# Patient Record
Sex: Male | Born: 1955 | Race: Black or African American | Hispanic: No | Marital: Married | Smoking: Never smoker
Health system: Southern US, Community
[De-identification: ages and names within clinical notes are randomized; demographics above are authoritative.]

## PROBLEM LIST (undated history)

## (undated) DIAGNOSIS — I10 Essential (primary) hypertension: Secondary | ICD-10-CM

## (undated) DIAGNOSIS — E119 Type 2 diabetes mellitus without complications: Secondary | ICD-10-CM

## (undated) DIAGNOSIS — E7800 Pure hypercholesterolemia, unspecified: Secondary | ICD-10-CM

## (undated) HISTORY — PX: HERNIA REPAIR: SHX51

---

## 2002-11-04 ENCOUNTER — Encounter: Payer: Self-pay | Admitting: Orthopedic Surgery

## 2002-11-04 ENCOUNTER — Ambulatory Visit (HOSPITAL_COMMUNITY): Admission: RE | Admit: 2002-11-04 | Discharge: 2002-11-04 | Payer: Self-pay | Admitting: Orthopedic Surgery

## 2003-07-13 ENCOUNTER — Ambulatory Visit (HOSPITAL_COMMUNITY): Admission: RE | Admit: 2003-07-13 | Discharge: 2003-07-13 | Payer: Self-pay | Admitting: Family Medicine

## 2003-07-13 ENCOUNTER — Encounter: Payer: Self-pay | Admitting: Family Medicine

## 2003-07-17 ENCOUNTER — Encounter: Payer: Self-pay | Admitting: Family Medicine

## 2003-07-17 ENCOUNTER — Ambulatory Visit (HOSPITAL_COMMUNITY): Admission: RE | Admit: 2003-07-17 | Discharge: 2003-07-17 | Payer: Self-pay | Admitting: Family Medicine

## 2005-07-01 ENCOUNTER — Encounter: Admission: RE | Admit: 2005-07-01 | Discharge: 2005-07-01 | Payer: Self-pay | Admitting: Emergency Medicine

## 2005-07-07 ENCOUNTER — Encounter: Admission: RE | Admit: 2005-07-07 | Discharge: 2005-07-07 | Payer: Self-pay | Admitting: Emergency Medicine

## 2005-07-25 ENCOUNTER — Encounter: Admission: RE | Admit: 2005-07-25 | Discharge: 2005-07-25 | Payer: Self-pay | Admitting: Emergency Medicine

## 2005-08-15 ENCOUNTER — Encounter: Admission: RE | Admit: 2005-08-15 | Discharge: 2005-08-15 | Payer: Self-pay | Admitting: Emergency Medicine

## 2016-03-23 ENCOUNTER — Emergency Department (HOSPITAL_COMMUNITY): Payer: Managed Care, Other (non HMO)

## 2016-03-23 ENCOUNTER — Observation Stay (HOSPITAL_COMMUNITY)
Admission: EM | Admit: 2016-03-23 | Discharge: 2016-03-24 | Disposition: A | Discharge status: Home / Self Care | Payer: Managed Care, Other (non HMO) | Attending: Internal Medicine | Admitting: Internal Medicine

## 2016-03-23 ENCOUNTER — Encounter (HOSPITAL_COMMUNITY): Payer: Self-pay | Admitting: *Deleted

## 2016-03-23 DIAGNOSIS — R079 Chest pain, unspecified: Secondary | ICD-10-CM | POA: Diagnosis present

## 2016-03-23 DIAGNOSIS — E119 Type 2 diabetes mellitus without complications: Secondary | ICD-10-CM | POA: Insufficient documentation

## 2016-03-23 DIAGNOSIS — I10 Essential (primary) hypertension: Secondary | ICD-10-CM | POA: Diagnosis not present

## 2016-03-23 DIAGNOSIS — E669 Obesity, unspecified: Secondary | ICD-10-CM | POA: Diagnosis not present

## 2016-03-23 DIAGNOSIS — R0789 Other chest pain: Secondary | ICD-10-CM | POA: Diagnosis not present

## 2016-03-23 DIAGNOSIS — I429 Cardiomyopathy, unspecified: Secondary | ICD-10-CM | POA: Diagnosis not present

## 2016-03-23 DIAGNOSIS — Z7984 Long term (current) use of oral hypoglycemic drugs: Secondary | ICD-10-CM | POA: Diagnosis not present

## 2016-03-23 DIAGNOSIS — Z7982 Long term (current) use of aspirin: Secondary | ICD-10-CM | POA: Diagnosis not present

## 2016-03-23 HISTORY — DX: Pure hypercholesterolemia, unspecified: E78.00

## 2016-03-23 HISTORY — DX: Type 2 diabetes mellitus without complications: E11.9

## 2016-03-23 HISTORY — DX: Essential (primary) hypertension: I10

## 2016-03-23 LAB — BASIC METABOLIC PANEL
ANION GAP: 9 (ref 5–15)
BUN: 14 mg/dL (ref 6–20)
CO2: 23 mmol/L (ref 22–32)
Calcium: 9.3 mg/dL (ref 8.9–10.3)
Chloride: 106 mmol/L (ref 101–111)
Creatinine, Ser: 1.03 mg/dL (ref 0.61–1.24)
GFR calc Af Amer: >60 mL/min (ref >60)
GLUCOSE: 153 mg/dL — AB (ref 65–99)
POTASSIUM: 4.1 mmol/L (ref 3.5–5.1)
Sodium: 138 mmol/L (ref 135–145)

## 2016-03-23 LAB — CBC
HCT: 37.1 % — ABNORMAL LOW (ref 39.0–52.0)
Hemoglobin: 12.2 g/dL — ABNORMAL LOW (ref 13.0–17.0)
MCH: 30.1 pg (ref 26.0–34.0)
MCHC: 32.9 g/dL (ref 30.0–36.0)
MCV: 91.6 fL (ref 78.0–100.0)
Platelets: 180 10*3/uL (ref 150–400)
RBC: 4.05 MIL/uL — ABNORMAL LOW (ref 4.22–5.81)
RDW: 12.6 % (ref 11.5–15.5)
WBC: 6.4 10*3/uL (ref 4.0–10.5)

## 2016-03-23 LAB — GLUCOSE, CAPILLARY: Glucose-Capillary: 110 mg/dL — ABNORMAL HIGH (ref 65–99)

## 2016-03-23 LAB — I-STAT TROPONIN, ED: Troponin i, poc: 0 ng/mL (ref 0.00–0.08)

## 2016-03-23 LAB — TROPONIN I

## 2016-03-23 LAB — CREATININE, SERUM: CREATININE: 1.01 mg/dL (ref 0.61–1.24)

## 2016-03-23 IMAGING — DX DG CHEST 2V
2 series · 2 of 2 positions shown · non-contrast
Comparison: None.

CLINICAL DATA: 59-year-old male with chest pain for 1 day. Initial
encounter.

EXAM:
CHEST  2 VIEW

[chest lat]
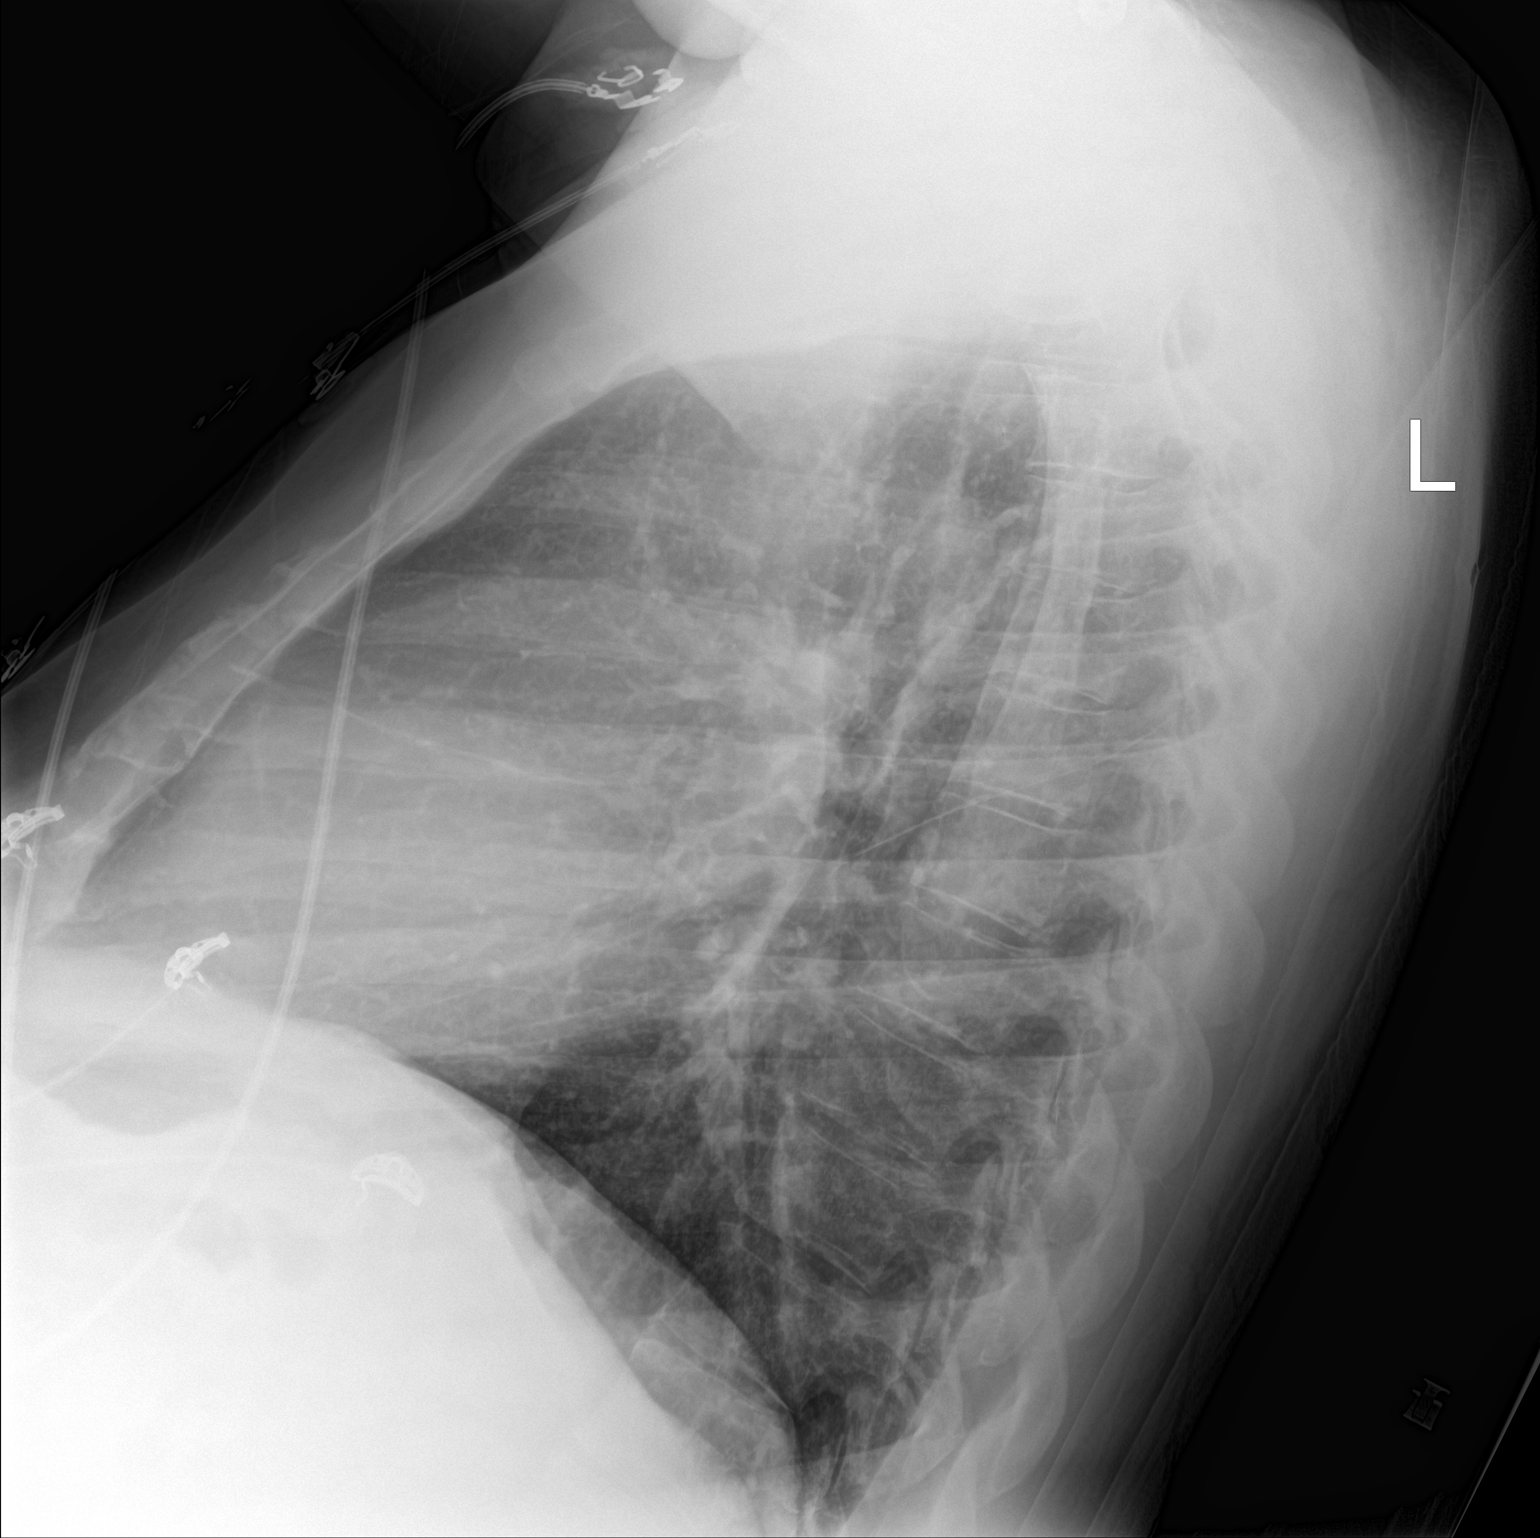

[chest ap]
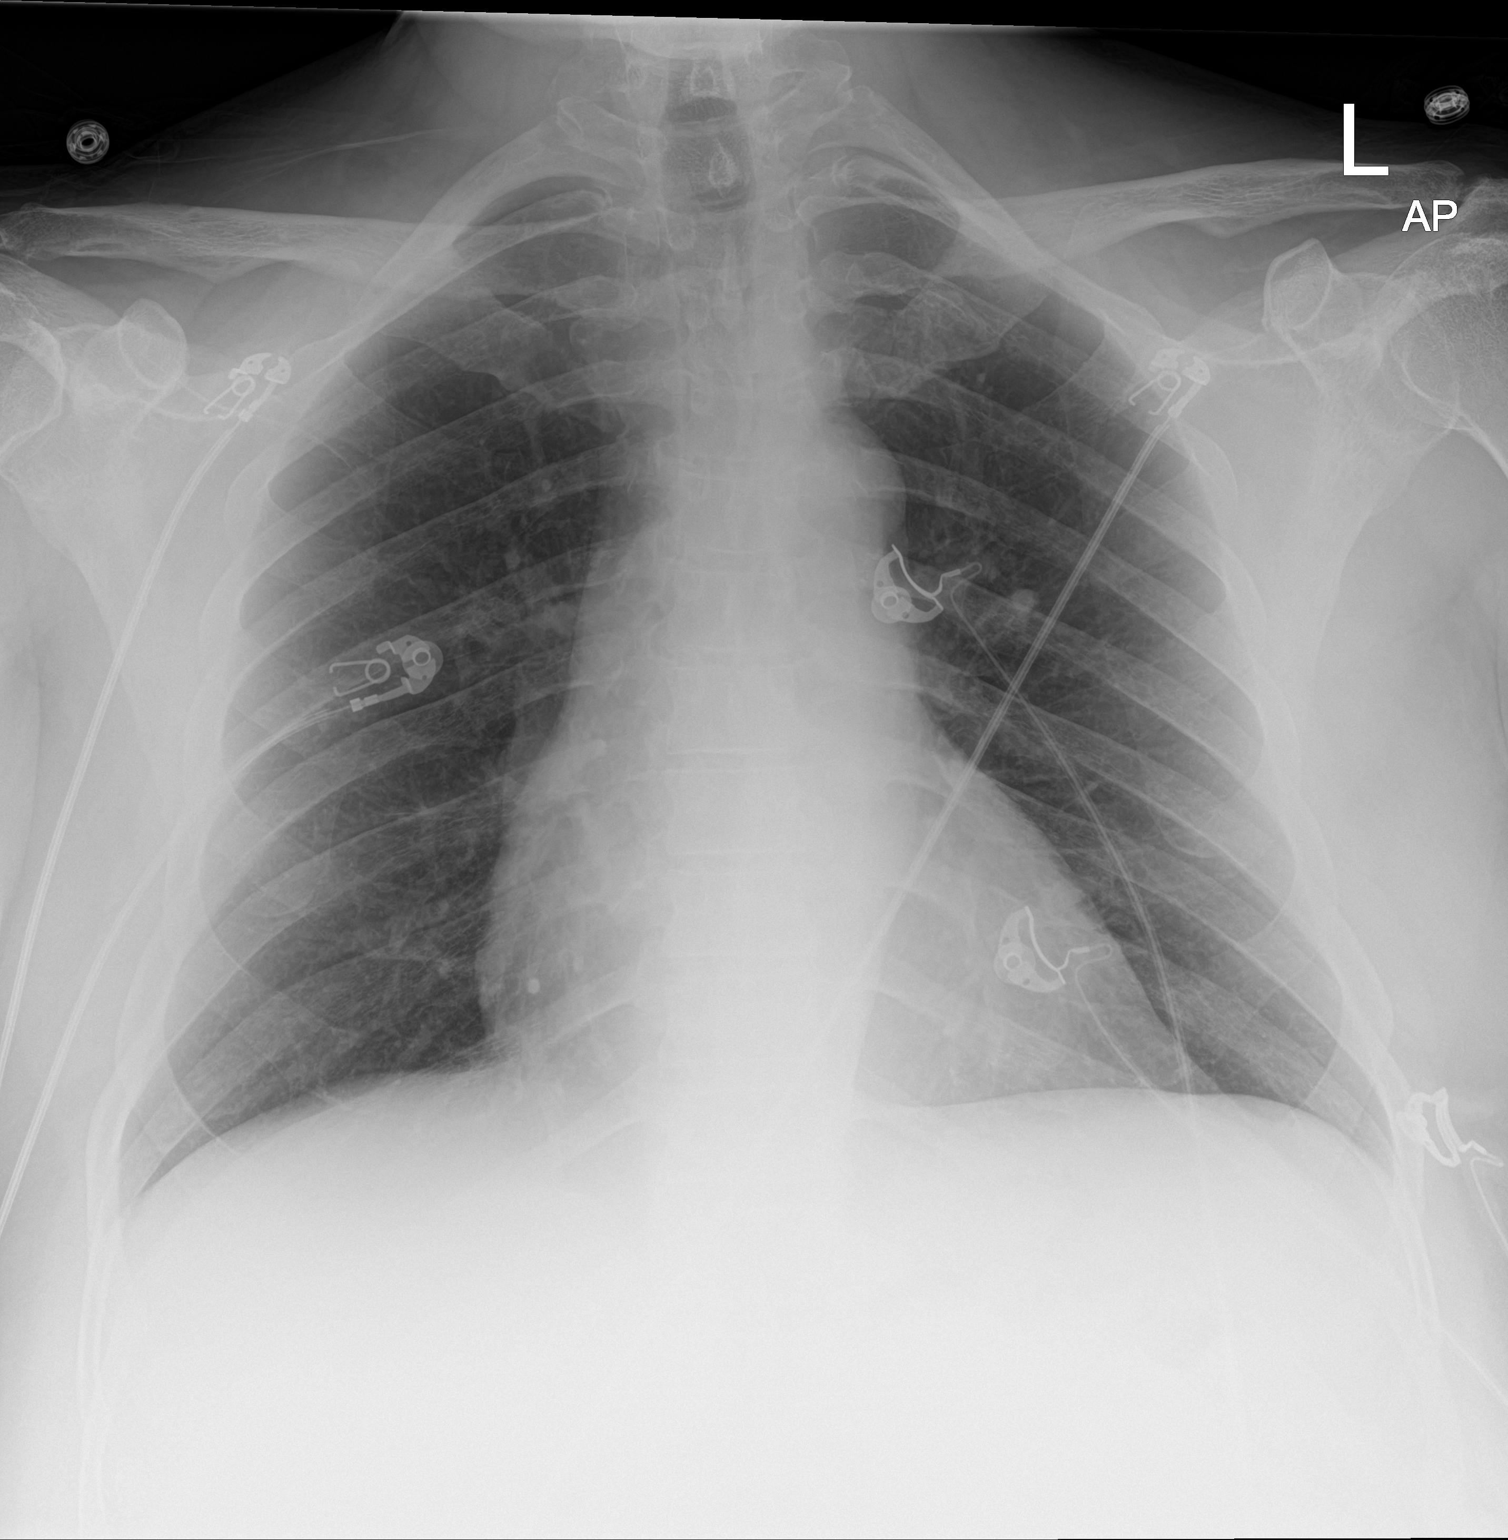

[2 of 2 positions shown; findings below may reference images not displayed]

FINDINGS: Normal lung volumes. Cardiac size at the upper limits of normal.
Other mediastinal contours are within normal limits. Visualized
tracheal air column is within normal limits. The lungs are clear. No
pneumothorax or pleural effusion. No osseous abnormality identified.
IMPRESSION: Negative, no acute cardiopulmonary abnormality.

## 2016-03-23 MED ORDER — ASPIRIN EC 81 MG PO TBEC
81.0000 mg | DELAYED_RELEASE_TABLET | Freq: Every day | ORAL | Status: DC
  Administered 2016-03-23: 81 mg via ORAL
  Filled 2016-03-23 (×2): qty 1

## 2016-03-23 MED ORDER — ONDANSETRON HCL 4 MG/2ML IJ SOLN: 4.0000 mg | Freq: Four times a day (QID) | INTRAMUSCULAR | Status: DC | PRN

## 2016-03-23 MED ORDER — GI COCKTAIL ~~LOC~~: 30.0000 mL | Freq: Four times a day (QID) | ORAL | Status: DC | PRN

## 2016-03-23 MED ORDER — ALPRAZOLAM 0.25 MG PO TABS: 0.2500 mg | ORAL_TABLET | Freq: Two times a day (BID) | ORAL | Status: DC | PRN

## 2016-03-23 MED ORDER — ATORVASTATIN CALCIUM 20 MG PO TABS
20.0000 mg | ORAL_TABLET | Freq: Every day | ORAL | Status: DC
  Administered 2016-03-23: 20 mg via ORAL
  Filled 2016-03-23: qty 1
  Filled 2016-03-23: qty 2

## 2016-03-23 MED ORDER — LISINOPRIL 10 MG PO TABS
10.0000 mg | ORAL_TABLET | Freq: Every day | ORAL | Status: DC
  Administered 2016-03-23: 10 mg via ORAL
  Filled 2016-03-23 (×2): qty 1

## 2016-03-23 MED ORDER — ENOXAPARIN SODIUM 40 MG/0.4ML ~~LOC~~ SOLN
40.0000 mg | SUBCUTANEOUS | Status: DC
  Administered 2016-03-23: 40 mg via SUBCUTANEOUS
  Filled 2016-03-23: qty 0.4

## 2016-03-23 MED ORDER — ACETAMINOPHEN 325 MG PO TABS: 650.0000 mg | ORAL_TABLET | ORAL | Status: DC | PRN

## 2016-03-23 NOTE — ED Notes (Signed)
Per EMS, pt had CP which started today after lunch. Pt received 324mg  of aspirin and 1 nitro in route. Pt reports decrease in pain. BP:118/78, HR:96, CBG: 156 NAD at this time. Pt alert x4.

## 2016-03-23 NOTE — ED Provider Notes (Signed)
CSN: [id number]     Arrival date & time 03/23/16  1540 History   First MD Initiated Contact with Patient 03/23/16 1556     Chief Complaint  Patient presents with  . Chest Pain      HPI Comments: 60 y.o. male with history of hypertension, hyperlipidemia, type 2 diabetes, who presents after an episode of chest tightness. Patient reports that he was at lunch at work when he began to develop a substernal chest tightness, 5 out of 10 in intensity. Denies radiation of the pain, shortness of breath, nausea, vomiting, lightheadedness, exertional component, pleuritic nature. He denies recent cough or fevers. He has never had pain like this before and has no prior cardiac history. No history of DVT or PE. The nurse at work saw him and his heart rate was 110 bpm and he was mildly hypertensive to 150s systolic, causing her to call EMS. He received 324 mg of aspirin and 1 nitroglycerin in route, but reports that his pain largely resolved prior to receiving those medications. He now complains only of a headache after receiving nitroglycerin.  Patient is a 60 y.o. male presenting with chest pain.  Chest Pain Associated symptoms: no abdominal pain, no back pain, no cough, no dizziness, no fatigue, no fever, no headache, no nausea, no palpitations, no shortness of breath, not vomiting and no weakness     Past Medical History  Diagnosis Date  . Hypertension   . Hypercholesteremia   . Diabetes mellitus without complication [facility])    Past Surgical History  Procedure Laterality Date  . Hernia repair     Family History  Problem Relation Age of Onset  . Valvular heart disease Mother   . Hypertension Father    Social History  Substance Use Topics  . Smoking status: Never Smoker   . Smokeless tobacco: None  . Alcohol Use: No    Review of Systems  Constitutional: Negative for fever, chills, activity change, appetite change and fatigue.  HENT: Negative for congestion, facial swelling, rhinorrhea and sore  throat.   Eyes: Negative for visual disturbance.  Respiratory: Positive for chest tightness (resolved). Negative for cough, shortness of breath and wheezing.   Cardiovascular: Positive for chest pain (resolved). Negative for palpitations and leg swelling.  Gastrointestinal: Negative for nausea, vomiting, abdominal pain, diarrhea, constipation and blood in stool.  Genitourinary: Negative for dysuria, frequency, hematuria, flank pain and difficulty urinating.  Musculoskeletal: Negative for myalgias, back pain, joint swelling, arthralgias, neck pain and neck stiffness.  Skin: Negative for rash.  Neurological: Negative for dizziness, syncope, weakness, light-headedness and headaches.  Psychiatric/Behavioral: Negative for behavioral problems, confusion and agitation.      Allergies  Review of patient's allergies indicates no known allergies.  Home Medications   Prior to Admission medications   Medication Sig Start Date End Date Taking? Authorizing Provider  aspirin EC 81 MG tablet Take 81 mg by mouth daily.   Yes Historical Provider, MD  atorvastatin (LIPITOR) 20 MG tablet Take 20 mg by mouth daily. 03/15/16  Yes Historical Provider, MD  lisinopril (PRINIVIL,ZESTRIL) 10 MG tablet Take 10 mg by mouth daily. 03/15/16  Yes Historical Provider, MD  metFORMIN (GLUCOPHAGE) 1000 MG tablet Take 1,000 mg by mouth 2 (two) times daily.   Yes Historical Provider, MD  naproxen sodium (ANAPROX) 220 MG tablet Take 440 mg by mouth daily as needed (back pain).   Yes Historical Provider, MD   BP 121/78 mmHg  Pulse 73  Temp(Src) 97.7 F (36.5 C) (Oral)  Resp 18  SpO2 100% Physical Exam  Constitutional: He is oriented to person, place, and time. He appears well-developed and well-nourished. No distress.  HENT:  Head: Normocephalic and atraumatic.  Right Ear: External ear normal.  Left Ear: External ear normal.  Nose: Nose normal.  Mouth/Throat: Oropharynx is clear and moist. No oropharyngeal exudate.   Eyes: Conjunctivae are normal. Pupils are equal, round, and reactive to light. Right eye exhibits no discharge. Left eye exhibits no discharge. No scleral icterus.  Neck: Normal range of motion. Neck supple. No tracheal deviation present.  Cardiovascular: Normal rate, regular rhythm and normal heart sounds.  Exam reveals no gallop and no friction rub.   No murmur heard. Pulmonary/Chest: Effort normal and breath sounds normal. No respiratory distress. He has no wheezes. He has no rales.  Abdominal: Soft. Bowel sounds are normal. He exhibits no distension and no mass. There is no tenderness. There is no rebound and no guarding.  Musculoskeletal: Normal range of motion. He exhibits no edema or tenderness.  Neurological: He is alert and oriented to person, place, and time. He exhibits normal muscle tone.  5/5 strength in the upper and lower extremities, with intact sensation to light touch. Normal speech, normal gait.   Skin: Skin is warm and dry. No rash noted. He is not diaphoretic.  Psychiatric: He has a normal mood and affect. His behavior is normal. Judgment and thought content normal.    ED Course  Procedures (including critical care time) Labs Review Labs Reviewed  CBC - Abnormal; Notable for the following:    RBC 4.05 (*)    Hemoglobin 12.2 (*)    HCT 37.1 (*)    All other components within normal limits  BASIC METABOLIC PANEL - Abnormal; Notable for the following:    Glucose, Bld 153 (*)    All other components within normal limits  GLUCOSE, CAPILLARY - Abnormal; Notable for the following:    Glucose-Capillary 110 (*)    All other components within normal limits  TROPONIN I  TROPONIN I  CREATININE, SERUM  TROPONIN I  Rosezena SensorI-STAT TROPOININ, ED    Imaging Review Dg Chest 2 View  03/23/2016  CLINICAL DATA:  60 year old male with chest pain for 1 day. Initial encounter. EXAM: CHEST  2 VIEW COMPARISON:  None. FINDINGS: Normal lung volumes. Cardiac size at the upper limits of normal.  Other mediastinal contours are within normal limits. Visualized tracheal air column is within normal limits. The lungs are clear. No pneumothorax or pleural effusion. No osseous abnormality identified. IMPRESSION: Negative, no acute cardiopulmonary abnormality. Electronically Signed   By: Odessa FlemingH  Hall M.D.   On: 03/23/2016 16:22   I have personally reviewed and evaluated these images and lab results as part of my medical decision-making.   EKG Interpretation   Date/Time:  Thursday March 23 2016 15:45:17 EDT Ventricular Rate:  88 PR Interval:  132 QRS Duration: 105 QT Interval:  378 QTC Calculation: 457 R Axis:   -45 Text Interpretation:  Sinus rhythm LAD, consider left anterior fascicular  block Abnormal R-wave progression, early transition Baseline wander in  lead(s) III aVL Confirmed by RAY MD, Duwayne HeckANIELLE ([id number]) on 03/23/2016 3:49:41  PM      MDM   Final diagnoses:  Chest pain, unspecified chest pain type    On arrival the patient is generally well-appearing. Hemodynamically stable. Currently denies chest pain or shortness of breath. 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 First troponin is 0.00. Given patient's risk  factors of hypertension, hyperlipidemia, diabetes, and the fact that he has never had a stress test, will admit for observation for overnight telemetry as well as stress testing. Patient remains chest pain-free while in the ED. Chest x-ray with no heart or pulmonary abnormalities and remainder of labs unremarkable.    Jenifer Ernestina Penna, MD 03/24/16 1610  Margarita Grizzle, MD 03/25/16 [phone]

## 2016-03-23 NOTE — H&P (Signed)
History and Physical  Patient Name: Benjamin Key     ZOX:[id number]    DOB: [birth year]    DOA: 03/23/2016 Referring physician: Cleatis PolkaJen Irick, MD PCP: Dennis BastYuri Cabeza, MD, [facility]     Chief Complaint: Chest pain  HPI: Benjamin Eveslvester Ledbetter is a 60 y.o. male with a past medical history significant for HTN, NIDDM and HTN who presents with chest pain.  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  This felt as if he had "stuffed cotton in his chest", and was moderate in intensity. He told the safety officer at work, who made him go to the work nurse, who found that he was tachycardic and blood pressure 150 systolic. She recommended he go to the ER. En route he was given nitroglycerin, and by arrival in the ER his pain had resolved.  He has no previous history of MI, no previous chest pain that he found remarkable, and no history of PCI, CABG, or stroke.   In the ED, the patient's initial ECG showed non-specific repol abnormalities in inferior and lateral leads and troponin was negative.  He was hemodynamically stable and without dyspnea or hypoxia.  No recent travel, immobilization or surgery.  TRH was asked to admit for observation, serial troponins and risk stratification.     Review of Systems:  Pt complains of chest pressure. Pt denies any leg swelling, dyspnea, DOE, palpitations, pleuritic pain, heartburn, URI symptoms.  All other systems negative except as just noted or noted in the history of present illness.  No Known Allergies  Prior to Admission medications   Medication Sig Start Date End Date Taking? Authorizing Provider  aspirin EC 81 MG tablet Take 81 mg by mouth daily.   Yes Historical Provider, MD  atorvastatin (LIPITOR) 20 MG tablet Take 20 mg by mouth daily. 03/15/16  Yes Historical Provider, MD  lisinopril (PRINIVIL,ZESTRIL) 10 MG tablet Take 10  mg by mouth daily. 03/15/16  Yes Historical Provider, MD  metFORMIN (GLUCOPHAGE) 1000 MG tablet Take 1,000 mg by mouth 2 (two) times daily.   Yes Historical Provider, MD  naproxen sodium (ANAPROX) 220 MG tablet Take 440 mg by mouth daily as needed (back pain).   Yes Historical Provider, MD    Past Medical History  Diagnosis Date  . Hypertension   . Hypercholesteremia   . Diabetes mellitus without complication [facility](HCC)     Past Surgical History  Procedure Laterality Date  . Hernia repair      Family history: family history includes Hypertension in his father; Valvular heart disease in his mother.  Social History: Patient lives with his wife.  [place] from [place], [place] in [place] years.  Never smoker.  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       Physical Exam: BP 125/73 mmHg  Pulse 78  Temp(Src) 98.4 F (36.9 C) (Oral)  Resp 24  SpO2 100% General appearance: Well-developed, adult male, alert and in no acute distress.   Eyes: Anicteric, conjunctiva pink, lids and lashes normal.     ENT: No nasal deformity, discharge, or epistaxis.  OP moist without lesions.   Skin: Warm and dry.   Cardiac: RRR, nl S1-S2, no murmurs appreciated.  Capillary refill is brisk.  JVP normal.  No LE edema.  Radial and DP pulses 2+ and symmetric.  No carotid bruits. Respiratory: Normal respiratory rate and  rhythm.  CTAB without rales or wheezes. Abdomen: Abdomen soft without rigidity.  No TTP. No ascites, distension.   MSK: No deformities or effusions.   Pain not reproduced with palpation of precordium or movement of arm. Neuro: Sensorium intact and responding to questions, attention normal.  Speech is fluent.  Moves all extremities equally and with normal coordination.    Psych: Behavior appropriate.  Affect normal.  No evidence of aural or visual hallucinations or delusions.       Labs on Admission:  The metabolic panel shows normal electro lites and renal function. No  hyperglycemia. The complete blood count shows no leukocytosis, anemia, thrombocytopenia. The initial troponin is negative.  Radiological Exams on Admission: Personally reviewed: Dg Chest 2 View  03/23/2016  CLINICAL DATA:  60 year old male with chest pain for 1 day. Initial encounter. EXAM: CHEST  2 VIEW COMPARISON:  None. FINDINGS: Normal lung volumes. Cardiac size at the upper limits of normal. Other mediastinal contours are within normal limits. Visualized tracheal air column is within normal limits. The lungs are clear. No pneumothorax or pleural effusion. No osseous abnormality identified. IMPRESSION: Negative, no acute cardiopulmonary abnormality. Electronically Signed   By: Odessa Fleming M.D.   On: 03/23/2016 16:22    EKG: Independently reviewed. Rate 88, QTC 457, sinus rhythm, flattened T waves in inferolateral leads, no ST segment deviations.    Assessment/Plan 1. Chest pain: This is new.  The patient has HEART score of 5. Angina is possible.  GERD doubted.  Does not PERC out given age, but suspicion very low.  Pain resolved at present.  We have been asked to admit the patient for observation and etiology consultation with Cardiology tomorrow.  -Serial troponins are ordered -Telemetry -Consult to cardiology, appreciate recommendations   2. NIDDM:  Normoglycemic.  Last HgbA1c reportedly in the 7s. -Hold metformin on off chance that patient needs cath (unlikely), would need to start SSI if patient stays past tomorrow morning -Continue statin and aspirin  3. HTN:  -Continue lisinopril     DVT PPx: Lovenox Diet: NPO after 4am, may advance diet if no stress testing Consultants: Cardiology Code Status: Full Family Communication: Wife, present at bedside, all questions answered. Patient seen 7:54 PM on 03/23/2016.  Disposition Plan:  Observe for arrhythmia on telemetry, serial troponins and subsequent risk stratification by Cardiology.  If testing negative, home  after.      Alberteen Sam [facility] Pager [phone]

## 2016-03-24 ENCOUNTER — Observation Stay (HOSPITAL_COMMUNITY): Payer: Managed Care, Other (non HMO)

## 2016-03-24 DIAGNOSIS — E119 Type 2 diabetes mellitus without complications: Secondary | ICD-10-CM

## 2016-03-24 DIAGNOSIS — E785 Hyperlipidemia, unspecified: Secondary | ICD-10-CM | POA: Diagnosis not present

## 2016-03-24 DIAGNOSIS — I10 Essential (primary) hypertension: Secondary | ICD-10-CM

## 2016-03-24 DIAGNOSIS — I429 Cardiomyopathy, unspecified: Secondary | ICD-10-CM | POA: Diagnosis not present

## 2016-03-24 DIAGNOSIS — R079 Chest pain, unspecified: Secondary | ICD-10-CM | POA: Diagnosis not present

## 2016-03-24 LAB — NM MYOCAR MULTI W/SPECT W/WALL MOTION / EF
CSEPED: 8 min
CSEPEDS: 57 s
CSEPPHR: 160 {beats}/min
Estimated workload: 10.1 METS
MPHR: 159 {beats}/min
Percent HR: 100 %
Rest HR: 82 {beats}/min

## 2016-03-24 LAB — GLUCOSE, CAPILLARY
GLUCOSE-CAPILLARY: 150 mg/dL — AB (ref 65–99)
Glucose-Capillary: 194 mg/dL — ABNORMAL HIGH (ref 65–99)
Glucose-Capillary: 266 mg/dL — ABNORMAL HIGH (ref 65–99)

## 2016-03-24 LAB — TROPONIN I
Troponin I: <0.03 ng/mL (ref <0.031)
Troponin I: <0.03 ng/mL (ref <0.031)

## 2016-03-24 MED ORDER — INSULIN ASPART 100 UNIT/ML ~~LOC~~ SOLN: 0.0000 [IU] | Freq: Every day | SUBCUTANEOUS | Status: DC

## 2016-03-24 MED ORDER — TECHNETIUM TC 99M SESTAMIBI - CARDIOLITE
30.0000 | Freq: Once | INTRAVENOUS | Status: AC | PRN
  Administered 2016-03-24: 30 via INTRAVENOUS

## 2016-03-24 MED ORDER — INSULIN ASPART 100 UNIT/ML ~~LOC~~ SOLN
0.0000 [IU] | Freq: Three times a day (TID) | SUBCUTANEOUS | Status: DC
  Administered 2016-03-24: 8 [IU] via SUBCUTANEOUS

## 2016-03-24 MED ORDER — METOPROLOL TARTRATE 25 MG PO TABS: 25.0000 mg | ORAL_TABLET | Freq: Two times a day (BID) | ORAL | Status: AC

## 2016-03-24 MED ORDER — TECHNETIUM TC 99M SESTAMIBI GENERIC - CARDIOLITE
10.0000 | Freq: Once | INTRAVENOUS | Status: AC | PRN
  Administered 2016-03-24: 10 via INTRAVENOUS

## 2016-03-24 NOTE — Progress Notes (Signed)
Report received from ED in regards to pt current medical status and pt arrived to the to [id number],  Pt oriented to the unit and room, VSS, telemetry applied and verified; pt currently denies pain,  pt skin intact with no pressure ulcer or wounds noted. Pt resting comfortably in bed with call light within reach, will continue to monitor pt.

## 2016-03-24 NOTE — Discharge Summary (Signed)
Physician Discharge Summary  Benjamin Key ZOX:[id number] DOB: 1956-09-17 DOA: 03/23/2016  PCP: Benjamin BastYuri Cabeza, MD Admit date: 03/23/2016 Discharge date: 03/24/2016  Time spent: 25 minutes  Recommendations for Outpatient Follow-up:  1. Discharge home with outpatient PCP follow-up in 1-2 weeks 2. Follow-up with cardiology changed   Discharge Diagnoses:  Principal Problems:   Chest pain  Active problems   Essential hypertension   Diabetes mellitus without complication (HCC)   Obesity   Cardiomyopathy (HCC)   Discharge Condition: FAIR  Diet recommendation: Heart healthy/diabetic  CODE STATUS: Full code   Filed Weights   03/24/16 0549  Weight: 115.259 kg (254 lb 1.6 oz)    History of present illness:  60 year old male with history of hypertension, diabetes mellitus metformin, presented with acute onset of chest pain. Pain was centrally locating, at work (involves production, lifting light weights), felt heavy in the chest and loss of almost an hour. He was told to see the work nurse who found him to be tachycardic with systolic blood pressure 150. He was sent to the ED. En Route he was given nitroglycerin. Chest pain has resolved when he came to the ED. No prior symptoms, lifting heavy weights, fevers, chills, shortness of breath, prior history of stress test or coronary angiogram. No significant family history recent travel. Patient admitted for rule out ACS.  Hospital Course:  Chest pain Possibly musculoskeletal. However heart score was 5. Serial cardiac enzymes negative. EKG showed LAD with left anterior fascicular block and abnormal R-wave progression. (no old EKG to compare). No further symptoms. Seen by cardiology and patient underwent nuclear stress test which was negative for ischemia, normal LV wall motion. Left ventricular EF was low at 42%. Patient seen very stable and can be discharged home. He is already on baby aspirin, lisinopril and statin which is continued I  have added beta blocker given his low EF. He will be followed in the cardiology office.  Cardiomyopathy Low EF of 42% on stress test. Patient is euvolemic. Continue aspirin, statin and lisinopril. Added metoprolol. Follow-up with cardiology.  Type 2 diabetes mellitus Stable. Continue metformin.  Essential hypertension Stable. Continue lisinopril. Added metoprolol.  Patient stable to be discharged home with outpatient follow-up.    Procedures:  Lexiscan Myoview  Consultations:  Cardiology    Discharge Exam: Filed Vitals:   03/24/16 1413 03/24/16 1415  BP: 206/84   Pulse: 150 160  Temp:    Resp:      General: Middle aged male in no acute distress HEENT: No pallor, moist mucosa Chest: Clear bilaterally CVS: Normal S1 and S2, no murmurs rub or gallop GI: Soft, nondistended, nontender Musculoskeletal: Warm, no edema   Discharge Instructions    Current Discharge Medication List    START taking these medications   Details  metoprolol tartrate (LOPRESSOR) 25 MG tablet Take 1 tablet (25 mg total) by mouth 2 (two) times daily. Qty: 60 tablet, Refills: 0      CONTINUE these medications which have NOT CHANGED   Details  aspirin EC 81 MG tablet Take 81 mg by mouth daily.    atorvastatin (LIPITOR) 20 MG tablet Take 20 mg by mouth daily.    lisinopril (PRINIVIL,ZESTRIL) 10 MG tablet Take 10 mg by mouth daily.    metFORMIN (GLUCOPHAGE) 1000 MG tablet Take 1,000 mg by mouth 2 (two) times daily.    naproxen sodium (ANAPROX) 220 MG tablet Take 440 mg by mouth daily as needed (back pain).       No Known Allergies  Follow-up Information    Follow up with Theodore Demark, PA On 04/17/2016.   Why:  10:00 AM     Contact information:   [address] [place], [place] [address] [phone]       The results of significant diagnostics from this hospitalization (including imaging, microbiology, ancillary and laboratory) are listed below for reference.     Significant Diagnostic Studies: Dg Chest 2 View  03/23/2016  CLINICAL DATA:  60 year old male with chest pain for 1 day. Initial encounter. EXAM: CHEST  2 VIEW COMPARISON:  None. FINDINGS: Normal lung volumes. Cardiac size at the upper limits of normal. Other mediastinal contours are within normal limits. Visualized tracheal air column is within normal limits. The lungs are clear. No pneumothorax or pleural effusion. No osseous abnormality identified. IMPRESSION: Negative, no acute cardiopulmonary abnormality. Electronically Signed   By: Odessa Fleming M.D.   On: 03/23/2016 16:22   Nm Myocar Multi W/spect W/wall Motion / Ef  03/24/2016  CLINICAL DATA:  Chest pain, diabetes, and hypertension. EXAM: MYOCARDIAL IMAGING WITH SPECT (REST AND PHARMACOLOGIC-STRESS) GATED LEFT VENTRICULAR WALL MOTION STUDY LEFT VENTRICULAR EJECTION FRACTION TECHNIQUE: Standard myocardial SPECT imaging was performed after resting intravenous injection of 10 mCi Tc-20m sestamibi. Subsequently, intravenous infusion of Lexiscan was performed under the supervision of the Cardiology staff. At peak effect of the drug, 30 mCi Tc-30m sestamibi was injected intravenously and standard myocardial SPECT imaging was performed. Quantitative gated imaging was also performed to evaluate left ventricular wall motion, and estimate left ventricular ejection fraction. COMPARISON:  Two-view chest x-ray 03/23/2016 FINDINGS: Perfusion: No decreased activity in the left ventricle on stress imaging to suggest reversible ischemia or infarction. Wall Motion: Normal left ventricular wall motion. No left ventricular dilation. Left Ventricular Ejection Fraction: 42% % End diastolic volume 125 ml End systolic volume 73 ml IMPRESSION: 1. No reversible ischemia or infarction. 2. Normal left ventricular wall motion. 3. Left ventricular ejection fraction 42% 4. Intermediate-risk stress test findings*. *2012 Appropriate Use Criteria for Coronary Revascularization Focused  Update: J Am Coll Cardiol. 2012;59(9):857-881. http://content.dementiazones.com.aspx?articleid=[email or web address] Electronically Signed   By: Marin Roberts M.D.   On: 03/24/2016 15:40    Microbiology: No results found for this or any previous visit (from the past 240 hour(s)).   Labs: Basic Metabolic Panel:  Recent Labs Lab 03/23/16 1656 03/23/16 2146  NA 138  --   K 4.1  --   CL 106  --   CO2 23  --   GLUCOSE 153*  --   BUN 14  --   CREATININE 1.03 1.01  CALCIUM 9.3  --    Liver Function Tests: No results for input(s): AST, ALT, ALKPHOS, BILITOT, PROT, ALBUMIN in the last 168 hours. No results for input(s): LIPASE, AMYLASE in the last 168 hours. No results for input(s): AMMONIA in the last 168 hours. CBC:  Recent Labs Lab 03/23/16 1556  WBC 6.4  HGB 12.2*  HCT 37.1*  MCV 91.6  PLT 180   Cardiac Enzymes:  Recent Labs Lab 03/23/16 2146 03/24/16 0101 03/24/16 0340  TROPONINI <0.03 <0.03 <0.03   BNP: BNP (last 3 results) No results for input(s): BNP in the last 8760 hours.  ProBNP (last 3 results) No results for input(s): PROBNP in the last 8760 hours.  CBG:  Recent Labs Lab 03/23/16 2046 03/24/16 0656 03/24/16 1133  GLUCAP 110* 194* 150*       Signed:  Eddie North MD.  [facility] 03/24/2016, 4:20 PM

## 2016-03-24 NOTE — Progress Notes (Signed)
Patient states he takes metformin twice a day and does not think he took it last night before coming to the hospital. Pt states this is reason for elevated CBG. Pt resting with call bell within reach.  Will continue to monitor. Thomas HoffBurton, Amandeep Hogston McClintock, RN

## 2016-03-24 NOTE — Consult Note (Signed)
CARDIOLOGY CONSULT NOTE   Patient ID: Benjamin Key MRN: [id number] DOB/AGE: 02-08-1956 60 y.o.  Admit date: 03/23/2016  Primary Physician   No primary care provider on file. Primary Cardiologist: New Reason for Consultation: chest pain   HPI: Mr. Benjamin Key is a 60 year old male with past medical history of hypertension, HLD, and DM. He is followed by Dr. Luiz Ironabeza with [facility].   He presented to the ED on 03/23/16 with complaints of chest tightness. He was eating lunch at work and developed chest tightness without radiation.  No associated SOB, nausea, vomiting. He went to see the nurse at his work place and was noted to have a SBP of 150 and tachycardic in 110's.  His pain resolved with administration of SL Nitro.    He has never had any pain like this before in the past.  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  He has not noticed any chest pain or SOB recently.   His EKG was not concerning for ischemia.  Troponin has been negative x 3.     Past Medical History  Diagnosis Date  . Hypertension   . Hypercholesteremia   . Diabetes mellitus without complication [facility](HCC)      Past Surgical History  Procedure Laterality Date  . Hernia repair      No Known Allergies  I have reviewed the patient's current medications . aspirin EC  81 mg Oral Daily  . atorvastatin  20 mg Oral Daily  . enoxaparin (LOVENOX) injection  40 mg Subcutaneous Q24H  . insulin aspart  0-15 Units Subcutaneous TID WC  . insulin aspart  0-5 Units Subcutaneous QHS  . lisinopril  10 mg Oral Daily     acetaminophen, ALPRAZolam, gi cocktail, ondansetron (ZOFRAN) IV  Prior to Admission medications   Medication Sig Start Date End Date Taking? Authorizing Provider  aspirin EC 81 MG tablet Take 81 mg by mouth daily.   Yes Historical Provider, MD  atorvastatin (LIPITOR) 20 MG tablet Take 20 mg by mouth daily. 03/15/16  Yes Historical Provider, MD  lisinopril  (PRINIVIL,ZESTRIL) 10 MG tablet Take 10 mg by mouth daily. 03/15/16  Yes Historical Provider, MD  metFORMIN (GLUCOPHAGE) 1000 MG tablet Take 1,000 mg by mouth 2 (two) times daily.   Yes Historical Provider, MD  naproxen sodium (ANAPROX) 220 MG tablet Take 440 mg by mouth daily as needed (back pain).   Yes Historical Provider, MD     Social History   Social History  . Marital Status: Married    Spouse Name: N/A  . Number of Children: N/A  . Years of Education: N/A   Occupational History  . Not on file.   Social History Main Topics  . Smoking status: Never Smoker   . Smokeless tobacco: Not on file  . Alcohol Use: No  . Drug Use: No  . Sexual Activity: Not on file   Other Topics Concern  . Not on file   Social History Narrative  . No narrative on file    No family status information on file.   Family History  Problem Relation Age of Onset  . Valvular heart disease Mother   . Hypertension Father      ROS:  Full 14 point review of systems complete and found to be negative unless listed above.  Physical Exam: Blood pressure 124/78, pulse 76, temperature 97.5 F (36.4 C), temperature source Oral, resp. rate 18, height 6'  1" (1.854 m), weight 254 lb 1.6 oz (115.259 kg), SpO2 100 %.  General: Well developed, well nourished, male in no acute distress Head: Eyes PERRLA, No xanthomas.   Normocephalic and atraumatic, oropharynx without edema or exudate. Dentition: Good Lungs: CTA Heart: HRRR S1 S2, no rub/gallop,No murmur. Pulses are 2+ extrem.   Neck: No carotid bruits. No lymphadenopathy. No JVD.  Abdomen: Bowel sounds present, abdomen soft and non-tender without masses or hernias noted. Msk:  No spine or cva tenderness. No weakness, no joint deformities or effusions. Extremities: No clubbing or cyanosis.No edema.  Neuro: Alert and oriented X 3. No focal deficits noted. Psych:  Good affect, responds appropriately Skin: No rashes or lesions noted.  Labs:   Lab Results   Component Value Date   WBC 6.4 03/23/2016   HGB 12.2* 03/23/2016   HCT 37.1* 03/23/2016   MCV 91.6 03/23/2016   PLT 180 03/23/2016   No results for input(s): INR in the last 72 hours.  Recent Labs Lab 03/23/16 1656 03/23/16 2146  NA 138  --   K 4.1  --   CL 106  --   CO2 23  --   BUN 14  --   CREATININE 1.03 1.01  CALCIUM 9.3  --   GLUCOSE 153*  --    Recent Labs  03/23/16 2146 03/24/16 0101 03/24/16 0340  TROPONINI <0.03 <0.03 <0.03    Recent Labs  03/23/16 1603  TROPIPOC 0.00    ECG:  NSR, non specific T wave changes.   Radiology:  Dg Chest 2 View  03/23/2016  CLINICAL DATA:  60 year old male with chest pain for 1 day. Initial encounter. EXAM: CHEST  2 VIEW COMPARISON:  None. FINDINGS: Normal lung volumes. Cardiac size at the upper limits of normal. Other mediastinal contours are within normal limits. Visualized tracheal air column is within normal limits. The lungs are clear. No pneumothorax or pleural effusion. No osseous abnormality identified. IMPRESSION: Negative, no acute cardiopulmonary abnormality. Electronically Signed   By: Odessa Fleming M.D.   On: 03/23/2016 16:22    ASSESSMENT AND PLAN:    Active Problems:   Chest pain   Essential hypertension   Diabetes mellitus without complication (HCC)   Obesity  1. Chest pain: He is currently chest pain free. No chest pain overnight.  He has cardiac risk factors including DM, HTN and HLD.  We will set up Bruce stress test. His BP has been well controlled since admission.     Signed: Little Ishikawa, NP 03/24/2016 10:38 AM Pager [phone]  Co-Sign MD  Patient seen and examined. Agree with assessment and plan. Mr. Benjamin Key is a 60 year old married AA male who has a history of diabetes mellitus for 7 years, hypertension, and had been recently started on hyper lipidemic medication due to his underlying diabetes.  He has remained active.  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  He has exercised at the gym and typically walks on a treadmill for 30 minutes.  He denies any exertionally precipitated chest discomfort.  Yesterday while at work, he experienced mild episode of chest tightness.  He mentioned it to a coworker and this was overheard by one of the safety personnel which led to him being evaluated by the plant nurse.  Patient's chest pain ultimately subsided spontaneously.  He was admitted for evaluation.  He denies recurrent chest tightness since admission.  His admission ECG independently reviewed by  me reveals normal sinus rhythm 80 bpm.  There is left axis deviation with possible left anterior hemiblock.  There is early transition.  There are nonspecific T changes in lead 3.  Troponins are negative.  There is no history of tobacco use.  There is family history for valvular heart disease as well as hypertension.  His physical examination is notable for obesity , BMI of 33.6.  HEENT was unremarkable.  He had thick neck.  There is no JVD.  His lungs were clear.  Rhythm was regular without ectopy.  He had moderate central adiposity.  Bowel sounds are present.  Pulses were 2+.  There is no clubbing, cyanosis or edema.  Neurologic exam is nonfocal.  Laboratories notable for normal electrolytes.  Renal function is normal with a creatinine of 1.01 and a GFR greater than 60.  He is mildly anemic with hemoglobin of 12.2, hematocrit 37.1.  A chest x-ray was unremarkable.  At present, the patient has not eaten.  He denies any exertionally precipitation of chest discomfort.  His cardiac risk factor profile is notable for his diabetes mellitus, hypertension and hyperlipidemia.  I have recommended that he undergo an exercise nuclear perfusion study later today for risk stratification of his chest pain make certain there is no associated ischemia.  Further recommendations will be forthcoming upon completion of his nuclear stress test.   Lennette Bihari, MD,  [facility] 03/24/2016 11:19 AM

## 2016-03-24 NOTE — Progress Notes (Signed)
Pt given discharge instructions, medication lists, follow up appointments, and when to call the doctor.  Pt verbalizes understanding. Called Dr. Gonzella Lexhungel to make sure prescription was sent to pharmacy on [address] [place]. Pt transported to main entrance to await ride. Thomas HoffBurton, Riddik Senna McClintock, RN

## 2016-04-17 ENCOUNTER — Ambulatory Visit: Payer: Managed Care, Other (non HMO) | Admitting: Physician Assistant
